# Patient Record
Sex: Female | Born: 1980 | Race: Black or African American | Hispanic: No | Marital: Single | Smoking: Never smoker
Health system: Southern US, Community
[De-identification: ages and names within clinical notes are randomized; demographics above are authoritative.]

## PROBLEM LIST (undated history)

## (undated) DIAGNOSIS — J45909 Unspecified asthma, uncomplicated: Secondary | ICD-10-CM

## (undated) HISTORY — PX: FOOT SURGERY: SHX648

## (undated) HISTORY — PX: CARPAL TUNNEL RELEASE: SHX101

---

## 2014-06-17 ENCOUNTER — Ambulatory Visit: Payer: Self-pay | Admitting: Physician Assistant

## 2014-08-12 ENCOUNTER — Emergency Department: Payer: Self-pay | Admitting: Emergency Medicine

## 2014-11-05 ENCOUNTER — Ambulatory Visit
Admit: 2014-11-05 | Disposition: A | Discharge status: Home / Self Care | Payer: Self-pay | Attending: Family Medicine | Admitting: Family Medicine

## 2014-11-05 LAB — COMPREHENSIVE METABOLIC PANEL
ALK PHOS: 30 U/L — AB
ALT: 16 U/L
ANION GAP: 8 (ref 7–16)
Albumin: 3.7 g/dL
BUN: 16 mg/dL
Bilirubin,Total: 0.4 mg/dL
CALCIUM: 9.1 mg/dL
CO2: 26 mmol/L
Chloride: 103 mmol/L
Creatinine: 1 mg/dL
EGFR (African American): >60
EGFR (Non-African Amer.): >60
GLUCOSE: 118 mg/dL — AB
Potassium: 3.3 mmol/L — ABNORMAL LOW
SGOT(AST): 33 U/L
SODIUM: 137 mmol/L
Total Protein: 7 g/dL

## 2014-11-05 LAB — CBC WITH DIFFERENTIAL/PLATELET
Basophil #: 0.1 10*3/uL (ref 0.0–0.1)
Basophil %: 1.1 %
EOS PCT: 1.7 %
Eosinophil #: 0.1 10*3/uL (ref 0.0–0.7)
HCT: 36.6 % (ref 35.0–47.0)
HGB: 12.1 g/dL (ref 12.0–16.0)
LYMPHS ABS: 2.6 10*3/uL (ref 1.0–3.6)
Lymphocyte %: 42.3 %
MCH: 26.7 pg (ref 26.0–34.0)
MCHC: 33.1 g/dL (ref 32.0–36.0)
MCV: 81 fL (ref 80–100)
Monocyte #: 0.7 x10 3/mm (ref 0.2–0.9)
Monocyte %: 11.1 %
NEUTROS PCT: 43.8 %
Neutrophil #: 2.7 10*3/uL (ref 1.4–6.5)
Platelet: 237 10*3/uL (ref 150–440)
RBC: 4.54 10*6/uL (ref 3.80–5.20)
RDW: 13.8 % (ref 11.5–14.5)
WBC: 6.1 10*3/uL (ref 3.6–11.0)

## 2014-11-05 LAB — TROPONIN I

## 2014-11-05 LAB — MAGNESIUM: MAGNESIUM: 1.9 mg/dL

## 2014-11-05 LAB — D-DIMER(ARMC): D-Dimer: 891 ng/ml

## 2014-11-05 LAB — CK-MB: CK-MB: 0.9 ng/mL

## 2014-11-05 LAB — CK: CK, Total: 244 U/L — ABNORMAL HIGH

## 2014-11-06 ENCOUNTER — Ambulatory Visit
Admit: 2014-11-06 | Disposition: A | Discharge status: Home / Self Care | Payer: Self-pay | Attending: Family Medicine | Admitting: Family Medicine

## 2014-11-06 LAB — TSH: Thyroid Stimulating Horm: 1.047 u[IU]/mL

## 2014-11-28 ENCOUNTER — Ambulatory Visit
Admission: EM | Admit: 2014-11-28 | Discharge: 2014-11-28 | Disposition: A | Discharge status: Home / Self Care | Payer: 59 | Attending: Family Medicine | Admitting: Family Medicine

## 2014-11-28 DIAGNOSIS — J029 Acute pharyngitis, unspecified: Secondary | ICD-10-CM | POA: Diagnosis not present

## 2014-11-28 DIAGNOSIS — J069 Acute upper respiratory infection, unspecified: Secondary | ICD-10-CM

## 2014-11-28 DIAGNOSIS — J3489 Other specified disorders of nose and nasal sinuses: Secondary | ICD-10-CM

## 2014-11-28 HISTORY — DX: Unspecified asthma, uncomplicated: J45.909

## 2014-11-28 LAB — RAPID STREP SCREEN (MED CTR MEBANE ONLY): STREPTOCOCCUS, GROUP A SCREEN (DIRECT): NEGATIVE

## 2014-11-28 MED ORDER — CEFUROXIME AXETIL 500 MG PO TABS
500.0000 mg | ORAL_TABLET | Freq: Two times a day (BID) | ORAL | Status: AC
Start: 2014-11-30 — End: ?

## 2014-11-28 MED ORDER — FEXOFENADINE-PSEUDOEPHED ER 180-240 MG PO TB24: 1.0000 | ORAL_TABLET | Freq: Every day | ORAL | Status: AC

## 2014-11-28 MED ORDER — FLUTICASONE PROPIONATE 50 MCG/ACT NA SUSP: 2.0000 | Freq: Every day | NASAL | Status: AC

## 2014-11-28 NOTE — ED Notes (Signed)
Pt states " I have had sore throat night. I had strep throat in Dec. I have had tonsil stones in the past."

## 2014-11-28 NOTE — Discharge Instructions (Signed)

## 2014-11-28 NOTE — ED Provider Notes (Signed)
CSN: [id number]     Arrival date & time 11/28/14  0818 History   First MD Initiated Contact with Patient 11/28/14 0912     Chief Complaint  Patient presents with  . Sore Throat   (Consider location/radiation/quality/duration/timing/severity/associated sxs/prior Treatment) Patient is a 34 y.o. female presenting with pharyngitis. The history is provided by the patient. No language interpreter was used.  Sore Throat This is a new problem. The current episode started more than 2 days ago (3 days ago). The problem occurs constantly. The problem has not changed since onset.The symptoms are aggravated by swallowing and sneezing. Nothing relieves the symptoms. She has tried nothing for the symptoms. The treatment provided no relief.    Past Medical History  Diagnosis Date  . Asthma    Past Surgical History  Procedure Laterality Date  . Carpal tunnel release    . Foot surgery     History reviewed. No pertinent family history. History  Substance Use Topics  . Smoking status: Never Smoker   . Smokeless tobacco: Not on file  . Alcohol Use: Yes   OB History    No data available     Review of Systems  HENT: Positive for postnasal drip and rhinorrhea.   Respiratory: Positive for cough.   Cardiovascular: Negative.   Endocrine: Negative.   Skin: Negative.     Allergies  Latex and Penciclovir  Home Medications   Prior to Admission medications   Medication Sig Start Date End Date Taking? Authorizing Provider  cefUROXime (CEFTIN) 500 MG tablet Take 1 tablet (500 mg total) by mouth 2 (two) times daily. Patient may start this medication on 11/30/2014. This prescription may be filled up until 01/10/2015. 11/30/14   Hassan RowanEugene Amaru Burroughs, MD  fexofenadine-pseudoephedrine (ALLEGRA-D ALLERGY & CONGESTION) 180-240 MG per 24 hr tablet Take 1 tablet by mouth daily. 11/28/14   Hassan RowanEugene Damarys Speir, MD  fluticasone (FLONASE) 50 MCG/ACT nasal spray Place 2 sprays into both nostrils daily. 11/28/14   Hassan RowanEugene Lorenza Winkleman, MD    BP 111/76 mmHg  Pulse 102  Temp(Src) 97.6 F (36.4 C) (Tympanic)  Ht 5\' 3"  (1.6 m)  Wt 210 lb (95.255 kg)  BMI 37.21 kg/m2  SpO2 99%  LMP 10/16/2014 (Exact Date) Physical Exam  Constitutional: She appears well-developed and well-nourished. No distress.  HENT:  Head: Normocephalic.  Right Ear: External ear normal.  Left Ear: External ear normal.  Nose: Rhinorrhea present. Right sinus exhibits no maxillary sinus tenderness and no frontal sinus tenderness. Left sinus exhibits no maxillary sinus tenderness.    Mouth/Throat: Normal dentition. No dental abscesses or dental caries. Posterior oropharyngeal edema and posterior oropharyngeal erythema present.  Vitals reviewed.   ED Course  Procedures (including critical care time) Labs Review Labs Reviewed  RAPID STREP SCREEN  CULTURE, GROUP A STREP [facility])   Results for orders placed or performed during the hospital encounter of 11/28/14  Rapid strep screen  Result Value Ref Range   Streptococcus, Group A Screen (Direct) NEGATIVE NEGATIVE   Strep test is negative. 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 She's had a history of having trouble sinuses as URI progresses. Will treat for now with some Allegra-D, Flonase and a work note for [employer]. If she is not better by Saturday I will give her a postdated prescription for some Ceftin for her to start taking. Imaging Review No results found.   MDM   1. URI (upper respiratory infection)   2. Pharyngitis   3. Rhinorrhea  Hassan RowanEugene Adison Jerger, MD 11/28/14 [phone]

## 2014-12-01 LAB — CULTURE, GROUP A STREP (THRC)

## 2015-01-29 ENCOUNTER — Ambulatory Visit
Admission: EM | Admit: 2015-01-29 | Discharge: 2015-01-29 | Disposition: A | Discharge status: Home / Self Care | Payer: 59 | Attending: Family Medicine | Admitting: Family Medicine

## 2015-01-29 ENCOUNTER — Encounter: Payer: Self-pay | Admitting: Emergency Medicine

## 2015-01-29 DIAGNOSIS — N926 Irregular menstruation, unspecified: Secondary | ICD-10-CM | POA: Diagnosis not present

## 2015-01-29 DIAGNOSIS — Z3202 Encounter for pregnancy test, result negative: Secondary | ICD-10-CM | POA: Diagnosis not present

## 2015-01-29 DIAGNOSIS — Z79899 Other long term (current) drug therapy: Secondary | ICD-10-CM | POA: Diagnosis not present

## 2015-01-29 DIAGNOSIS — J45909 Unspecified asthma, uncomplicated: Secondary | ICD-10-CM | POA: Diagnosis not present

## 2015-01-29 DIAGNOSIS — N898 Other specified noninflammatory disorders of vagina: Secondary | ICD-10-CM | POA: Insufficient documentation

## 2015-01-29 LAB — URINALYSIS COMPLETE WITH MICROSCOPIC (ARMC ONLY)
BACTERIA UA: NONE SEEN — AB
GLUCOSE, UA: NEGATIVE mg/dL
Hgb urine dipstick: NEGATIVE
Leukocytes, UA: NEGATIVE
Nitrite: NEGATIVE
PH: 5.5 (ref 5.0–8.0)
PROTEIN: 30 mg/dL — AB
RBC / HPF: NONE SEEN RBC/hpf (ref <3)
SPECIFIC GRAVITY, URINE: 1.025 (ref 1.005–1.030)

## 2015-01-29 LAB — WET PREP, GENITAL
Clue Cells Wet Prep HPF POC: NONE SEEN
Trich, Wet Prep: NONE SEEN
WBC, Wet Prep HPF POC: NONE SEEN
Yeast Wet Prep HPF POC: NONE SEEN

## 2015-01-29 LAB — CHLAMYDIA/NGC RT PCR (ARMC ONLY)
Chlamydia Tr: NOT DETECTED
N gonorrhoeae: NOT DETECTED

## 2015-01-29 LAB — PREGNANCY, URINE: PREG TEST UR: NEGATIVE

## 2015-01-29 LAB — HCG, QUANTITATIVE, PREGNANCY: hCG, Beta Chain, Quant, S: <1 m[IU]/mL (ref <5)

## 2015-01-29 NOTE — ED Notes (Addendum)
Discharge instructions went over with pt. Pt verbalized understanding of discharge information. Pt was ambulatory leaving clinic

## 2015-01-29 NOTE — Discharge Instructions (Signed)
Abnormal Uterine Bleeding Abnormal uterine bleeding means bleeding from the vagina that is not your normal menstrual period. This can be:  Bleeding or spotting between periods.  Bleeding after sex (sexual intercourse).  Bleeding that is heavier or more than normal.  Periods that last longer than usual.  Bleeding after menopause. There are many problems that may cause this. Treatment will depend on the cause of the bleeding. Any kind of bleeding that is not normal should be reviewed by your doctor.  HOME CARE Watch your condition for any changes. These actions may lessen any discomfort you are having:  Do not use tampons or douches as told by your doctor.  Change your pads often. You should get regular pelvic exams and Pap tests. Keep all appointments for tests as told by your doctor. GET HELP IF:  You are bleeding for more than 1 week.  You feel dizzy at times. GET HELP RIGHT AWAY IF:   You pass out.  You have to change pads every 15 to 30 minutes.  You have belly pain.  You have a fever.  You become sweaty or weak.  You are passing large blood clots from the vagina.  You feel sick to your stomach (nauseous) and throw up (vomit). MAKE SURE YOU:  Understand these instructions.  Will watch your condition.  Will get help right away if you are not doing well or get worse. Document Released: 04/25/2009 Document Revised: 07/03/2013 Document Reviewed: 01/25/2013 [facility] Patient Information 2015 [place]. This information is not intended to replace advice given to you by your health care provider. Make sure you discuss any questions you have with your health care provider.  Your pregnancy test was negative today, your wet prep was normal today. Please follow up with your PCP for general medical issues. Follow up with your OB/GYn for further evaluation of irregular periods, recheck of vaginal discharge if symptoms persist next week-call for appt. Your cultures are  pending, we will call you of any positive results if further testing is required.

## 2015-01-29 NOTE — ED Provider Notes (Signed)
CSN: [id number]     Arrival date & time 01/29/15  4098 History   None    Chief Complaint  Patient presents with  . Vaginal Discharge   (Consider location/radiation/quality/duration/timing/severity/associated sxs/prior Treatment) Patient is a 34 y.o. female presenting with vaginal discharge. The history is provided by the patient. No language interpreter was used.  Vaginal Discharge Quality:  Thin and white Severity:  Moderate Onset quality:  Sudden Timing:  Constant Progression:  Unchanged Chronicity:  Recurrent Context: spontaneously   Context comment:  Recent new partner exposure Relieved by:  Nothing Ineffective treatments:  OTC medications (tried 3 day yeast treatment without improvement, + odor) Associated symptoms: no abdominal pain, no dysuria, no fever, no nausea and no vomiting   Risk factors: unprotected sex     Past Medical History  Diagnosis Date  . Asthma    Past Surgical History  Procedure Laterality Date  . Carpal tunnel release    . Foot surgery     History reviewed. No pertinent family history. History  Substance Use Topics  . Smoking status: Never Smoker   . Smokeless tobacco: Not on file  . Alcohol Use: Yes   OB History    No data available     Review of Systems  Constitutional: Positive for appetite change. Negative for fever and chills.  HENT: Negative.   Respiratory: Negative for shortness of breath.   Cardiovascular: Negative for chest pain.  Gastrointestinal: Negative for nausea, vomiting, abdominal pain, diarrhea and constipation.  Endocrine: Negative.   Genitourinary: Positive for vaginal discharge. Negative for dysuria, hematuria and vaginal bleeding.  Skin: Negative for rash.  Allergic/Immunologic: Negative.   Neurological: Negative.   Hematological: Negative.   Psychiatric/Behavioral: Negative.   All other systems reviewed and are negative.   Allergies  Latex; Penciclovir; and Penicillins  Home Medications   Prior to  Admission medications   Medication Sig Start Date End Date Taking? Authorizing Provider  cefUROXime (CEFTIN) 500 MG tablet Take 1 tablet (500 mg total) by mouth 2 (two) times daily. Patient may start this medication on 11/30/2014. This prescription may be filled up until 01/10/2015. 11/30/14   Hassan Rowan, MD  fexofenadine-pseudoephedrine (ALLEGRA-D ALLERGY & CONGESTION) 180-240 MG per 24 hr tablet Take 1 tablet by mouth daily. 11/28/14   Hassan Rowan, MD  fluticasone (FLONASE) 50 MCG/ACT nasal spray Place 2 sprays into both nostrils daily. 11/28/14   Hassan Rowan, MD   BP 109/66 mmHg  Pulse 110  Temp(Src) 98 F (36.7 C) (Oral)  Resp 16  SpO2 100%  LMP 10/16/2014 Physical Exam  Constitutional: She appears well-developed and well-nourished. She is active and cooperative.  Non-toxic appearance. She does not have a sickly appearance. She does not appear ill. No distress.  HENT:  Head: Normocephalic.  Right Ear: Tympanic membrane normal.  Left Ear: Tympanic membrane normal.  Nose: Nose normal.  Mouth/Throat: Uvula is midline, oropharynx is clear and moist and mucous membranes are normal.  Neck: Trachea normal.  Cardiovascular: Normal rate, regular rhythm, normal heart sounds and normal pulses.   Pulmonary/Chest: Effort normal and breath sounds normal.  Abdominal: Normal appearance and bowel sounds are normal. There is no tenderness.  Genitourinary: Uterus normal. Pelvic exam was performed with patient supine. No labial fusion. There is no rash, tenderness, lesion or injury on the right labia. There is no rash, tenderness, lesion or injury on the left labia. Cervix exhibits no motion tenderness, no discharge and no friability. Right adnexum displays no mass, no tenderness and no  fullness. Left adnexum displays no mass, no tenderness and no fullness. No erythema, tenderness or bleeding in the vagina. No foreign body around the vagina. No signs of injury around the vagina. Vaginal discharge found.  Bi  manual /pelvic exam performed by this provider, escorted by RN, Tiffany. Pt tolerated well, cultures obtained. No CMT, no adnexal tenderness bilaterally, no nausea, vomiting or chills. + thin white discharge noted in vault.   Neurological: She is alert. No cranial nerve deficit or sensory deficit. GCS eye subscore is 4. GCS verbal subscore is 5. GCS motor subscore is 6.  Psychiatric: She has a normal mood and affect. Her speech is normal.  Nursing note and vitals reviewed.   ED Course  Procedures (including critical care time) Labs Review Labs Reviewed  URINALYSIS COMPLETEWITH MICROSCOPIC ([facility] ONLY) - Abnormal; Notable for the following:    Color, Urine AMBER (*)    APPearance CLOUDY (*)    Bilirubin Urine 1+ (*)    Ketones, ur 1+ (*)    Protein, ur 30 (*)    Bacteria, UA NONE SEEN (*)    Squamous Epithelial / LPF 6-30 (*)    All other components within normal limits  WET PREP, GENITAL  URINE CULTURE  CHLAMYDIA/NGC RT PCR ([facility] ONLY)  PREGNANCY, URINE  HCG, QUANTITATIVE, PREGNANCY   Results for orders placed or performed during the hospital encounter of 01/29/15  Wet prep, genital  Result Value Ref Range   Yeast Wet Prep HPF POC NONE SEEN NONE SEEN   Trich, Wet Prep NONE SEEN NONE SEEN   Clue Cells Wet Prep HPF POC NONE SEEN NONE SEEN   WBC, Wet Prep HPF POC NONE SEEN NONE SEEN  Pregnancy, urine  Result Value Ref Range   Preg Test, Ur NEGATIVE NEGATIVE  Urinalysis complete, with microscopic  Result Value Ref Range   Color, Urine AMBER (A) YELLOW   APPearance CLOUDY (A) CLEAR   Glucose, UA NEGATIVE NEGATIVE mg/dL   Bilirubin Urine 1+ (A) NEGATIVE   Ketones, ur 1+ (A) NEGATIVE mg/dL   Specific Gravity, Urine 1.025 1.005 - 1.030   Hgb urine dipstick NEGATIVE NEGATIVE   pH 5.5 5.0 - 8.0   Protein, ur 30 (A) NEGATIVE mg/dL   Nitrite NEGATIVE NEGATIVE   Leukocytes, UA NEGATIVE NEGATIVE   RBC / HPF NONE SEEN <3 RBC/hpf   WBC, UA 0-5 <3 WBC/hpf   Bacteria, UA  NONE SEEN (A) RARE   Squamous Epithelial / LPF 6-30 (A) RARE   Mucous PRESENT    Ca Oxalate Crys, UA PRESENT   hCG, quantitative, pregnancy  Result Value Ref Range   hCG, Beta Chain, Quant, S <1 <5 mIU/mL    Imaging Review No results found.   MDM   1. Vaginal discharge   2. Negative pregnancy test   3. Irregular menses     1008: Discussed results with pt: Your pregnancy test was negative today, your wet prep was normal today. Please follow up with your PCP for general medical issues. Follow up with your OB/GYn for further evaluation of irregular periods, recheck of vaginal discharge if symptoms persist next week-call for appt. Your cultures are pending, we will call you of any positive results if further testing is required. Pt verbalized understanding to this provider.   Clancy GourdJeanette Athira Janowicz, NP 01/29/15 1027

## 2015-01-29 NOTE — ED Notes (Signed)
Pt states that she has had vaginal discharge for at least a week. With her menstrual period is over a week late.

## 2015-01-30 LAB — URINE CULTURE: Culture: 30000

## 2015-03-31 ENCOUNTER — Telehealth: Payer: Self-pay

## 2015-03-31 NOTE — Telephone Encounter (Signed)
LVM for pt to return my call to schedule repeat US.

## 2015-03-31 NOTE — Telephone Encounter (Signed)
-----   Message from Rayann Heman, CMA sent at 02/03/2015  3:09 PM EDT ----- Regarding: repeat US Call pt to schedule repeat RUQ [place] limited to evaluate hepatic cyst.

## 2015-04-28 NOTE — Telephone Encounter (Signed)
Mailed letter to pt requesting her to call me and schedule repeat US.

## 2015-08-04 DIAGNOSIS — M25462 Effusion, left knee: Secondary | ICD-10-CM | POA: Diagnosis not present

## 2015-08-04 DIAGNOSIS — G5622 Lesion of ulnar nerve, left upper limb: Secondary | ICD-10-CM | POA: Diagnosis not present

## 2015-08-04 DIAGNOSIS — S83272A Complex tear of lateral meniscus, current injury, left knee, initial encounter: Secondary | ICD-10-CM | POA: Diagnosis not present

## 2015-08-04 DIAGNOSIS — G5602 Carpal tunnel syndrome, left upper limb: Secondary | ICD-10-CM | POA: Diagnosis not present

## 2015-08-12 DIAGNOSIS — M25562 Pain in left knee: Secondary | ICD-10-CM | POA: Diagnosis not present

## 2015-08-12 DIAGNOSIS — S83272A Complex tear of lateral meniscus, current injury, left knee, initial encounter: Secondary | ICD-10-CM | POA: Diagnosis not present

## 2015-08-19 DIAGNOSIS — G5622 Lesion of ulnar nerve, left upper limb: Secondary | ICD-10-CM | POA: Diagnosis not present

## 2015-08-19 DIAGNOSIS — G5602 Carpal tunnel syndrome, left upper limb: Secondary | ICD-10-CM | POA: Diagnosis not present

## 2015-08-22 DIAGNOSIS — G5602 Carpal tunnel syndrome, left upper limb: Secondary | ICD-10-CM | POA: Diagnosis not present

## 2015-08-22 DIAGNOSIS — S83272A Complex tear of lateral meniscus, current injury, left knee, initial encounter: Secondary | ICD-10-CM | POA: Diagnosis not present

## 2015-08-22 DIAGNOSIS — M25562 Pain in left knee: Secondary | ICD-10-CM | POA: Diagnosis not present

## 2015-08-22 DIAGNOSIS — M25462 Effusion, left knee: Secondary | ICD-10-CM | POA: Diagnosis not present

## 2015-08-22 DIAGNOSIS — G5622 Lesion of ulnar nerve, left upper limb: Secondary | ICD-10-CM | POA: Diagnosis not present

## 2015-09-09 DIAGNOSIS — G5622 Lesion of ulnar nerve, left upper limb: Secondary | ICD-10-CM | POA: Diagnosis not present

## 2015-09-09 DIAGNOSIS — M65862 Other synovitis and tenosynovitis, left lower leg: Secondary | ICD-10-CM | POA: Diagnosis not present

## 2015-09-09 DIAGNOSIS — G5602 Carpal tunnel syndrome, left upper limb: Secondary | ICD-10-CM | POA: Diagnosis not present

## 2015-09-09 DIAGNOSIS — M25562 Pain in left knee: Secondary | ICD-10-CM | POA: Diagnosis not present

## 2015-09-09 DIAGNOSIS — M25462 Effusion, left knee: Secondary | ICD-10-CM | POA: Diagnosis not present

## 2015-09-09 DIAGNOSIS — M2242 Chondromalacia patellae, left knee: Secondary | ICD-10-CM | POA: Diagnosis not present

## 2015-09-09 DIAGNOSIS — S83272A Complex tear of lateral meniscus, current injury, left knee, initial encounter: Secondary | ICD-10-CM | POA: Diagnosis not present

## 2015-09-09 DIAGNOSIS — M94262 Chondromalacia, left knee: Secondary | ICD-10-CM | POA: Diagnosis not present

## 2015-09-15 DIAGNOSIS — M25562 Pain in left knee: Secondary | ICD-10-CM | POA: Diagnosis not present

## 2015-09-15 DIAGNOSIS — S83272D Complex tear of lateral meniscus, current injury, left knee, subsequent encounter: Secondary | ICD-10-CM | POA: Diagnosis not present

## 2015-09-25 DIAGNOSIS — M25562 Pain in left knee: Secondary | ICD-10-CM | POA: Diagnosis not present

## 2015-09-25 DIAGNOSIS — S83272D Complex tear of lateral meniscus, current injury, left knee, subsequent encounter: Secondary | ICD-10-CM | POA: Diagnosis not present

## 2015-10-01 DIAGNOSIS — M25562 Pain in left knee: Secondary | ICD-10-CM | POA: Diagnosis not present

## 2015-10-01 DIAGNOSIS — S83272D Complex tear of lateral meniscus, current injury, left knee, subsequent encounter: Secondary | ICD-10-CM | POA: Diagnosis not present

## 2015-10-10 DIAGNOSIS — M25562 Pain in left knee: Secondary | ICD-10-CM | POA: Diagnosis not present

## 2015-10-10 DIAGNOSIS — S83272D Complex tear of lateral meniscus, current injury, left knee, subsequent encounter: Secondary | ICD-10-CM | POA: Diagnosis not present

## 2015-10-17 DIAGNOSIS — S83272D Complex tear of lateral meniscus, current injury, left knee, subsequent encounter: Secondary | ICD-10-CM | POA: Diagnosis not present

## 2015-10-17 DIAGNOSIS — M25562 Pain in left knee: Secondary | ICD-10-CM | POA: Diagnosis not present

## 2015-10-21 DIAGNOSIS — F4322 Adjustment disorder with anxiety: Secondary | ICD-10-CM | POA: Diagnosis not present

## 2015-10-22 DIAGNOSIS — M25562 Pain in left knee: Secondary | ICD-10-CM | POA: Diagnosis not present

## 2015-10-22 DIAGNOSIS — S83272D Complex tear of lateral meniscus, current injury, left knee, subsequent encounter: Secondary | ICD-10-CM | POA: Diagnosis not present

## 2015-10-31 DIAGNOSIS — S83272D Complex tear of lateral meniscus, current injury, left knee, subsequent encounter: Secondary | ICD-10-CM | POA: Diagnosis not present

## 2015-10-31 DIAGNOSIS — M25562 Pain in left knee: Secondary | ICD-10-CM | POA: Diagnosis not present

## 2015-11-14 DIAGNOSIS — S83272D Complex tear of lateral meniscus, current injury, left knee, subsequent encounter: Secondary | ICD-10-CM | POA: Diagnosis not present

## 2015-11-14 DIAGNOSIS — M25562 Pain in left knee: Secondary | ICD-10-CM | POA: Diagnosis not present

## 2015-11-20 DIAGNOSIS — S83272D Complex tear of lateral meniscus, current injury, left knee, subsequent encounter: Secondary | ICD-10-CM | POA: Diagnosis not present

## 2015-11-20 DIAGNOSIS — M25562 Pain in left knee: Secondary | ICD-10-CM | POA: Diagnosis not present

## 2016-01-29 DIAGNOSIS — F4322 Adjustment disorder with anxiety: Secondary | ICD-10-CM | POA: Diagnosis not present

## 2016-01-29 DIAGNOSIS — F909 Attention-deficit hyperactivity disorder, unspecified type: Secondary | ICD-10-CM | POA: Diagnosis not present

## 2016-02-26 DIAGNOSIS — Z113 Encounter for screening for infections with a predominantly sexual mode of transmission: Secondary | ICD-10-CM | POA: Diagnosis not present

## 2016-02-26 DIAGNOSIS — Z01419 Encounter for gynecological examination (general) (routine) without abnormal findings: Secondary | ICD-10-CM | POA: Diagnosis not present

## 2016-02-26 DIAGNOSIS — Z6836 Body mass index (BMI) 36.0-36.9, adult: Secondary | ICD-10-CM | POA: Diagnosis not present

## 2016-02-26 DIAGNOSIS — Z1389 Encounter for screening for other disorder: Secondary | ICD-10-CM | POA: Diagnosis not present

## 2016-02-26 DIAGNOSIS — Z114 Encounter for screening for human immunodeficiency virus [HIV]: Secondary | ICD-10-CM | POA: Diagnosis not present

## 2016-02-26 DIAGNOSIS — Z3009 Encounter for other general counseling and advice on contraception: Secondary | ICD-10-CM | POA: Diagnosis not present

## 2016-03-30 DIAGNOSIS — Z23 Encounter for immunization: Secondary | ICD-10-CM | POA: Diagnosis not present

## 2016-03-30 DIAGNOSIS — Z111 Encounter for screening for respiratory tuberculosis: Secondary | ICD-10-CM | POA: Diagnosis not present

## 2016-08-03 DIAGNOSIS — F4322 Adjustment disorder with anxiety: Secondary | ICD-10-CM | POA: Diagnosis not present

## 2016-08-19 DIAGNOSIS — Z30431 Encounter for routine checking of intrauterine contraceptive device: Secondary | ICD-10-CM | POA: Diagnosis not present

## 2016-08-19 DIAGNOSIS — Z3202 Encounter for pregnancy test, result negative: Secondary | ICD-10-CM | POA: Diagnosis not present

## 2016-08-19 DIAGNOSIS — Z30430 Encounter for insertion of intrauterine contraceptive device: Secondary | ICD-10-CM | POA: Diagnosis not present

## 2016-08-19 DIAGNOSIS — Z6837 Body mass index (BMI) 37.0-37.9, adult: Secondary | ICD-10-CM | POA: Diagnosis not present

## 2016-08-27 DIAGNOSIS — F909 Attention-deficit hyperactivity disorder, unspecified type: Secondary | ICD-10-CM | POA: Diagnosis not present

## 2016-08-27 DIAGNOSIS — F4322 Adjustment disorder with anxiety: Secondary | ICD-10-CM | POA: Diagnosis not present

## 2016-09-02 DIAGNOSIS — Z30431 Encounter for routine checking of intrauterine contraceptive device: Secondary | ICD-10-CM | POA: Diagnosis not present

## 2016-09-02 DIAGNOSIS — Z6837 Body mass index (BMI) 37.0-37.9, adult: Secondary | ICD-10-CM | POA: Diagnosis not present

## 2016-09-24 DIAGNOSIS — M25552 Pain in left hip: Secondary | ICD-10-CM | POA: Diagnosis not present

## 2016-10-13 DIAGNOSIS — Z30431 Encounter for routine checking of intrauterine contraceptive device: Secondary | ICD-10-CM | POA: Diagnosis not present

## 2016-10-13 DIAGNOSIS — Z6837 Body mass index (BMI) 37.0-37.9, adult: Secondary | ICD-10-CM | POA: Diagnosis not present

## 2016-12-07 IMAGING — CR DG HAND COMPLETE 3+V*L*
1 series · 3 of 3 positions shown · non-contrast
Comparison: None.

CLINICAL DATA: First and second metacarpal pain after direct trauma
from IV pole. Initial encounter.

EXAM:
LEFT HAND - COMPLETE 3+ VIEW

[Series 1: x hand pa left · 0.14mm/px · 3 of 3 slices shown]
[im 1/3]
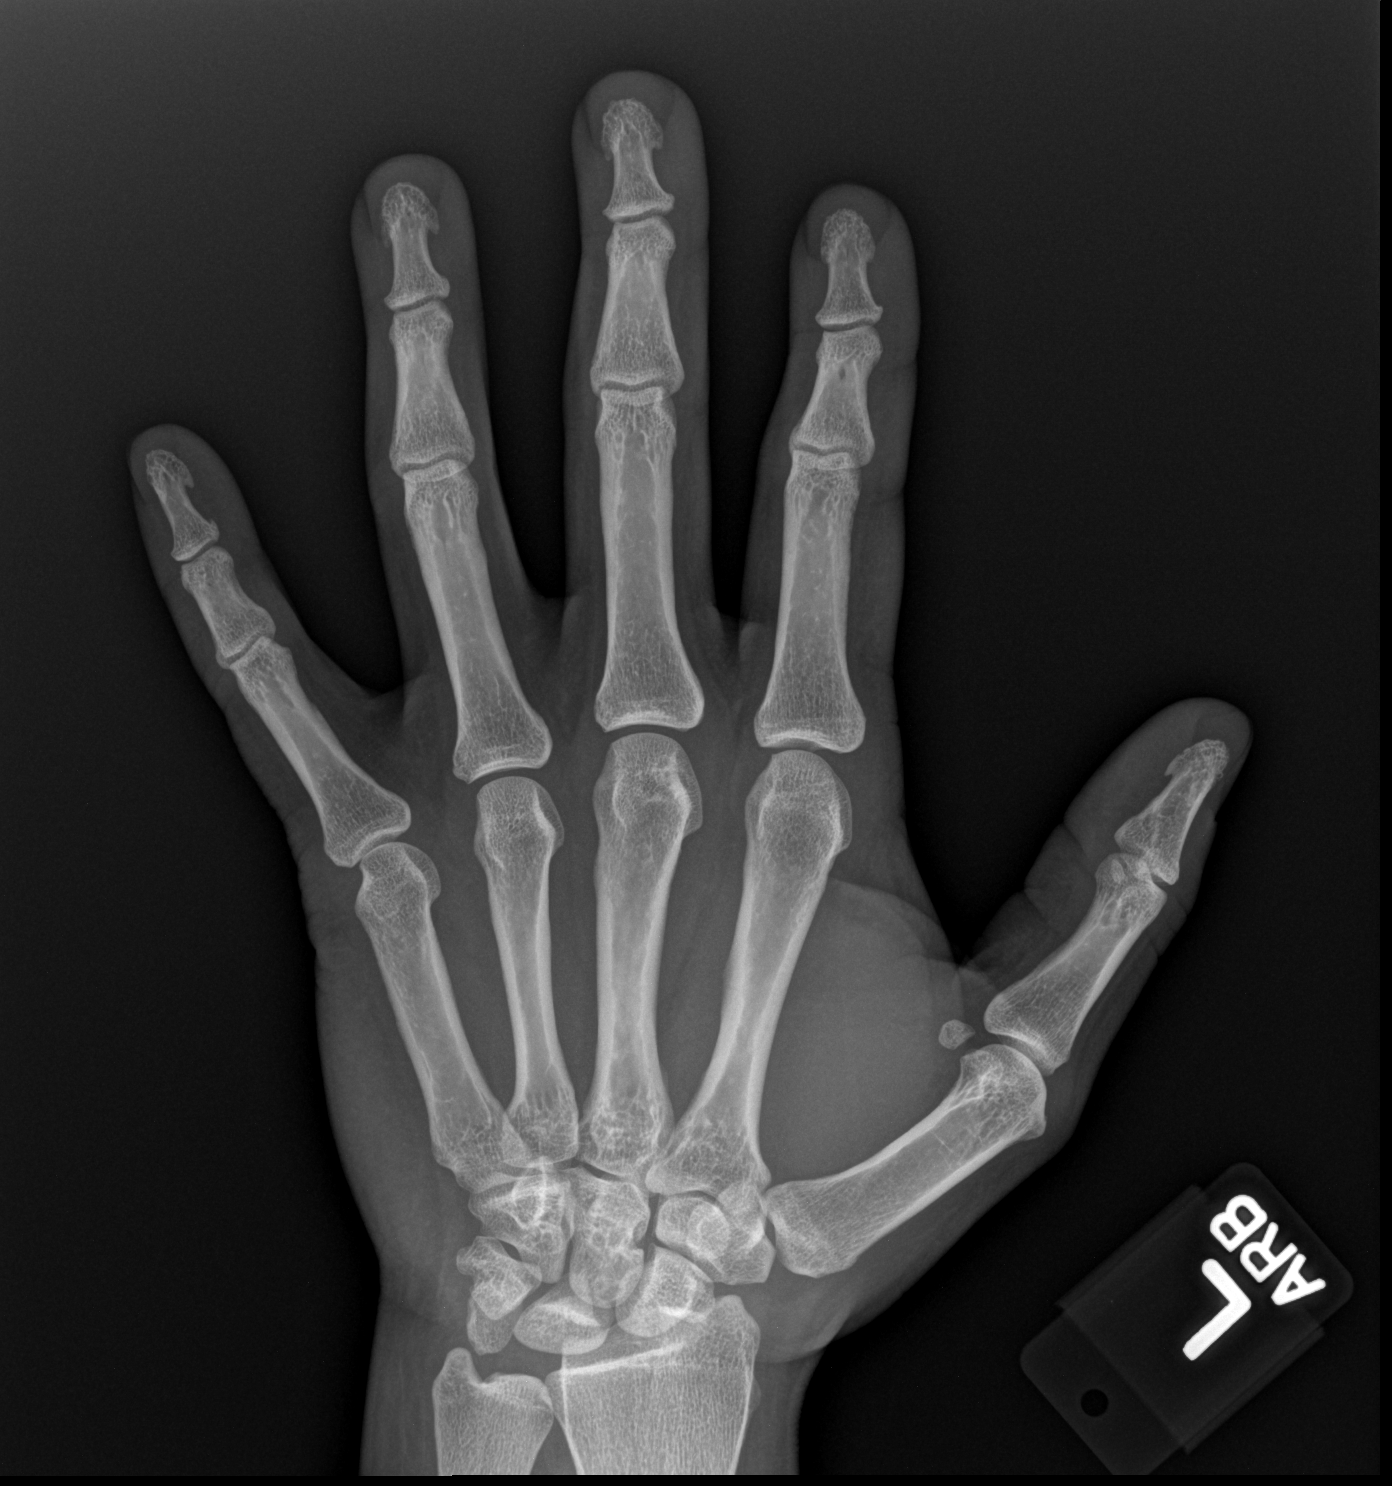
[im 2/3]
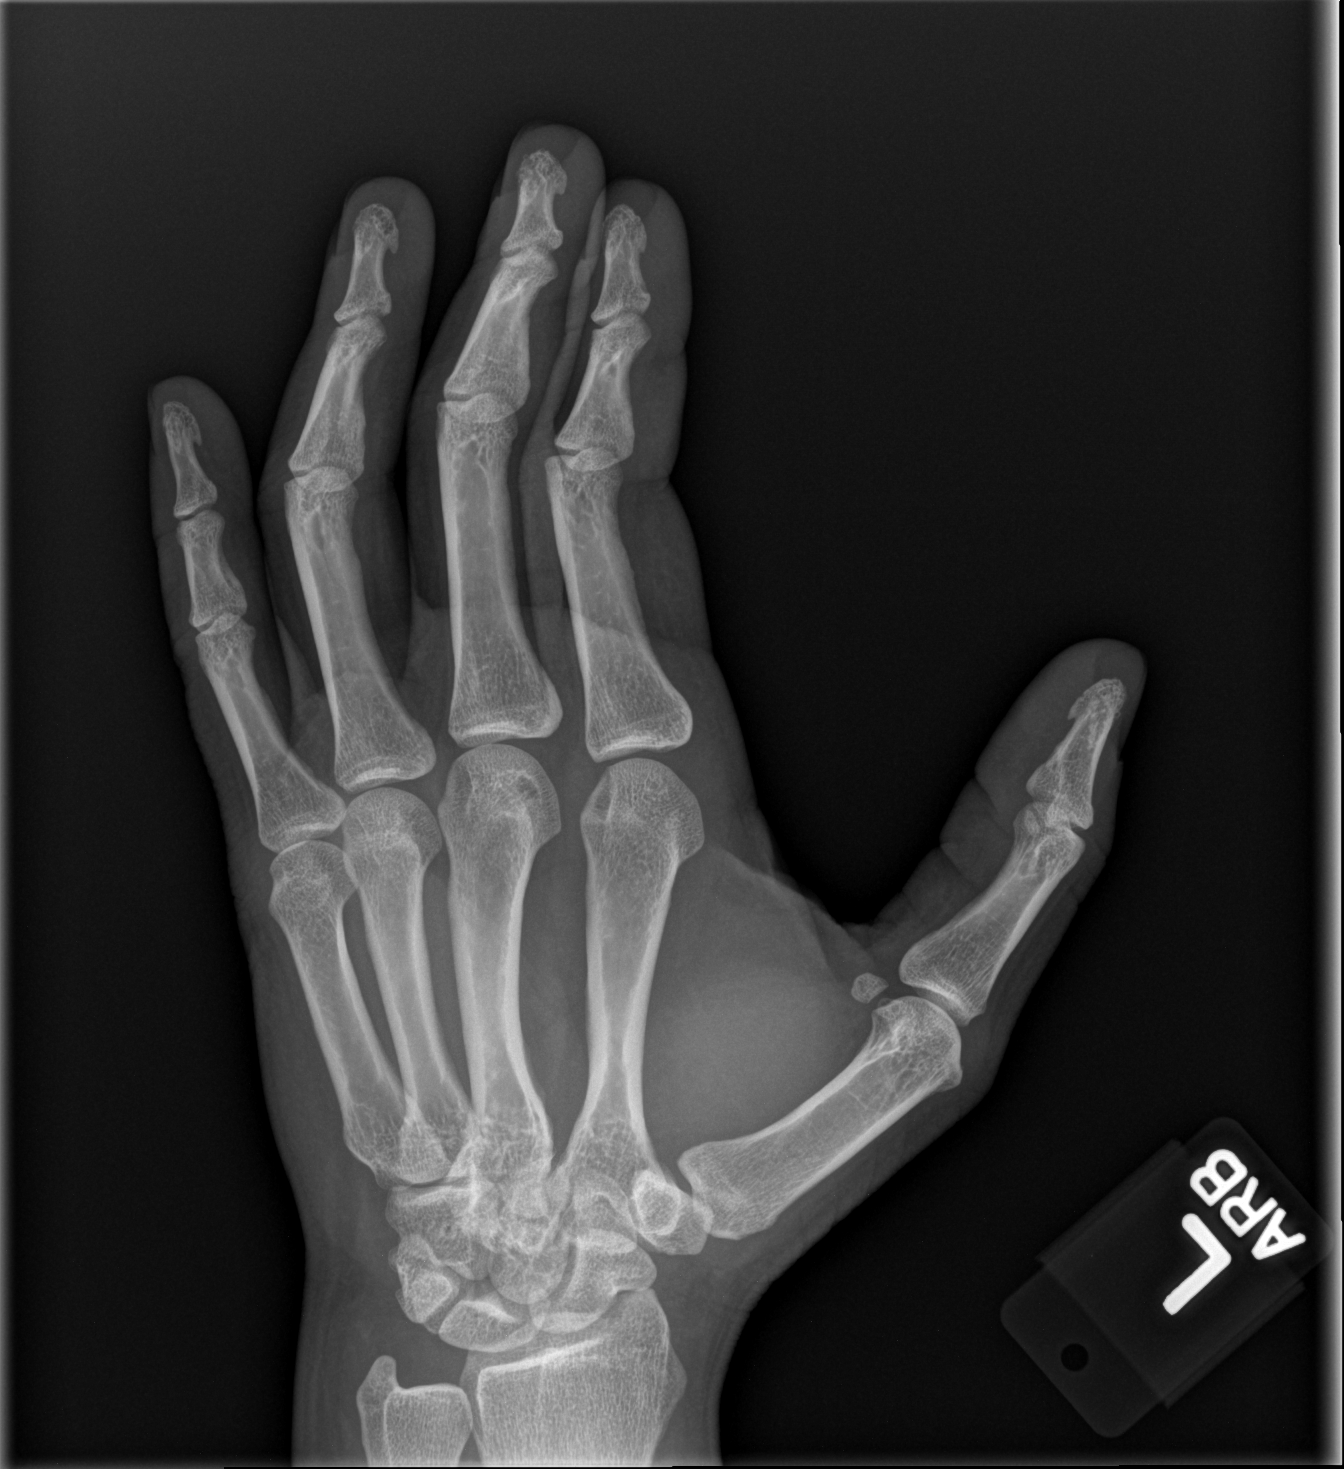
[im 3/3]
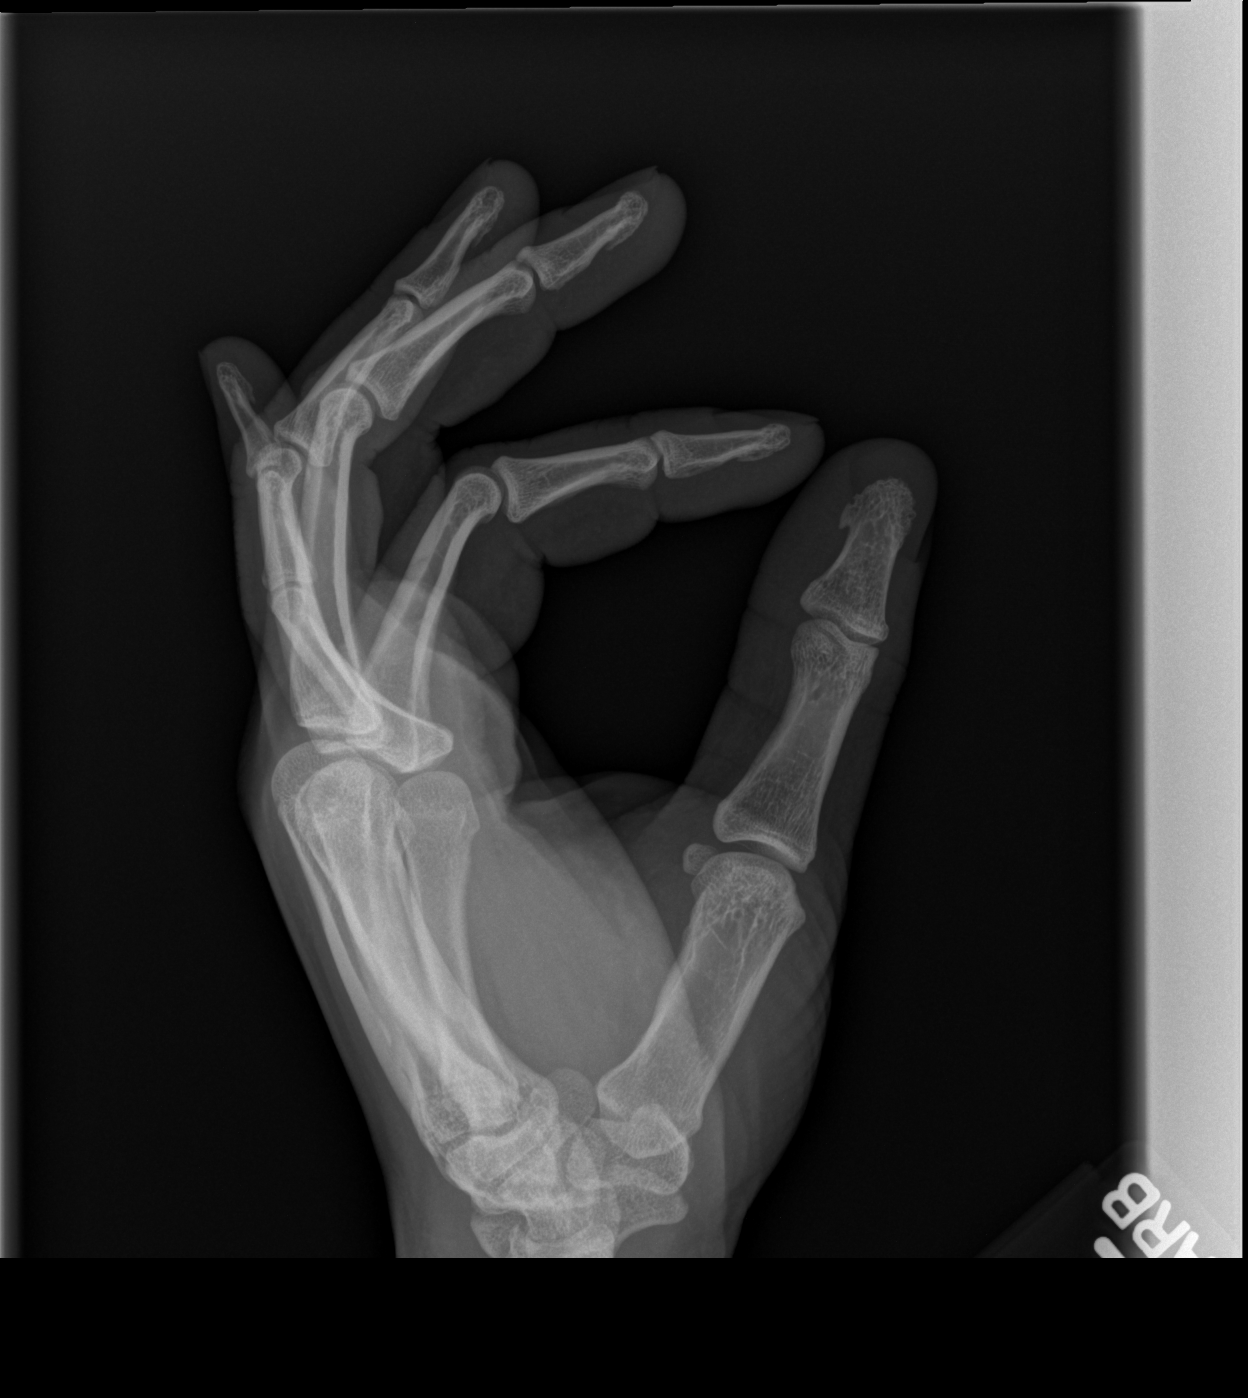

[3 of 3 positions shown; findings below may reference images not displayed]

FINDINGS: No acute fracture or dislocation.

No radiopaque foreign body or subcutaneous gas.

There is incidental, chronic appearing cystic change in the
capitate.
IMPRESSION: No acute osseous findings.

## 2017-03-03 IMAGING — CT CT ANGIO CHEST
1 of 2 series · 18 of 30 positions shown · IV contrast (APPLIED)
Comparison: None.

CLINICAL DATA: Shortness of breath with exertion. Recent prolonged
road trip. Elevated D-dimer. Persistent tachycardia for several
days.

EXAM:
CT ANGIOGRAPHY CHEST WITH CONTRAST
TECHNIQUE: Multidetector CT imaging of the chest was performed using the
standard protocol during bolus administration of intravenous
contrast. Multiplanar CT image reconstructions and MIPs were
obtained to evaluate the vascular anatomy.
CONTRAST:  100 cc Omnipaque 350

[Series 6: pe 1.0 thins · axial · 0.61mm/px · z∈[-560,-348]mm · 18 of 239 slices shown]
[im 14/239  lung]
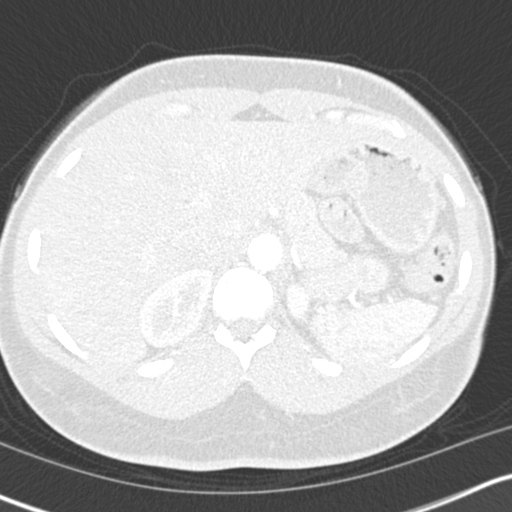
[im 27/239  mediastinal]
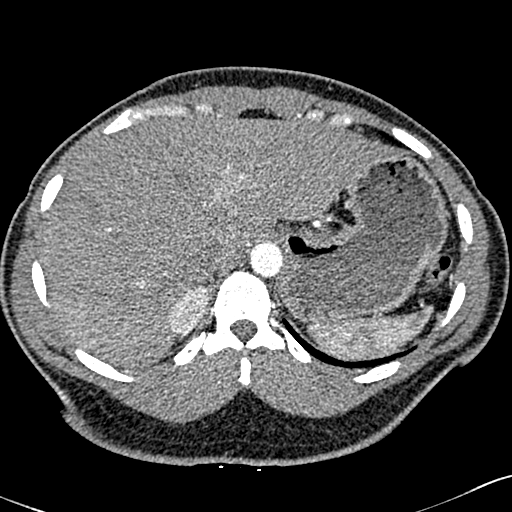
[im 40/239  lung]
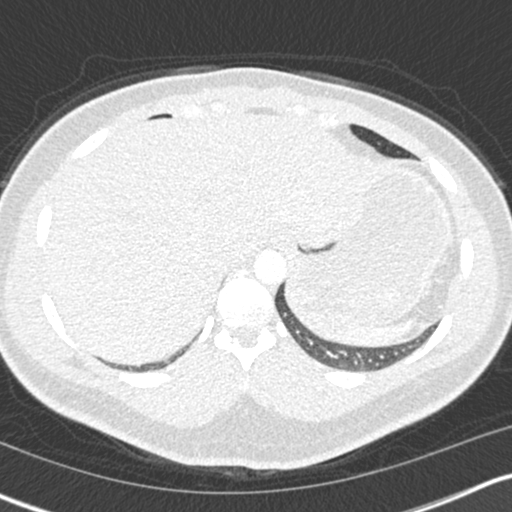
[im 53/239  mediastinal]
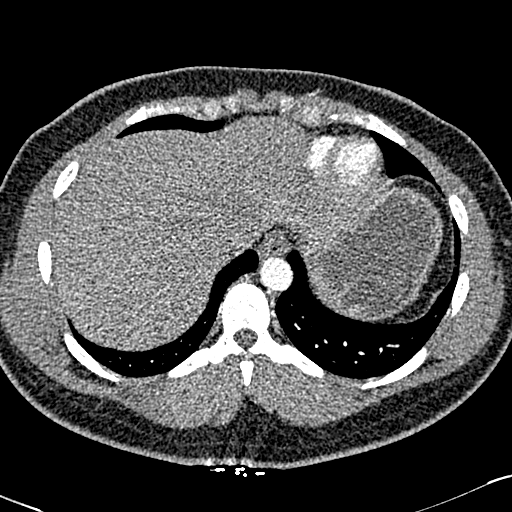
[im 67/239  lung]
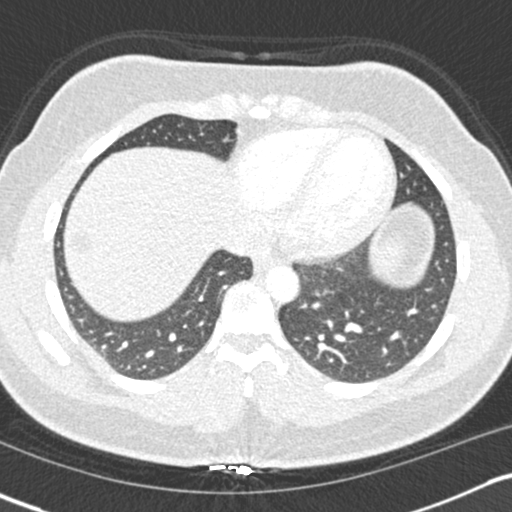
[im 80/239  mediastinal]
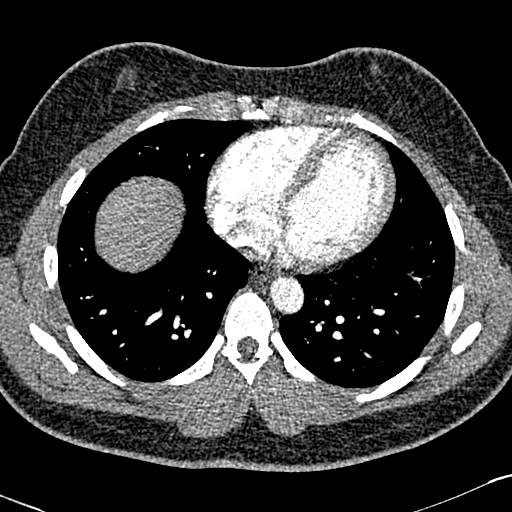
[im 93/239  lung]
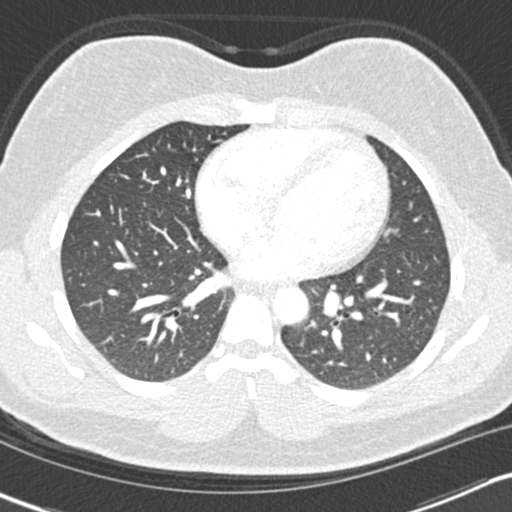
[im 106/239  mediastinal]
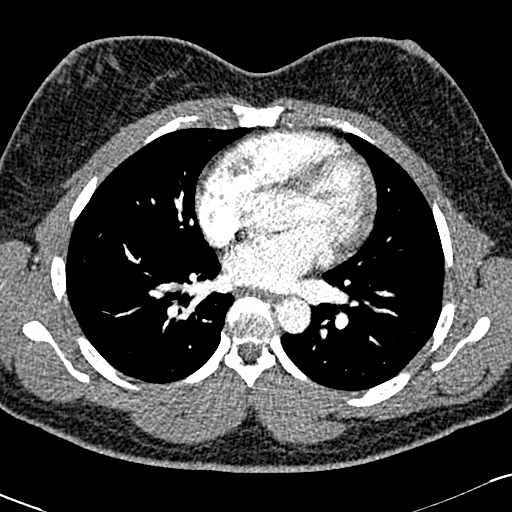
[im 110/239  lung]
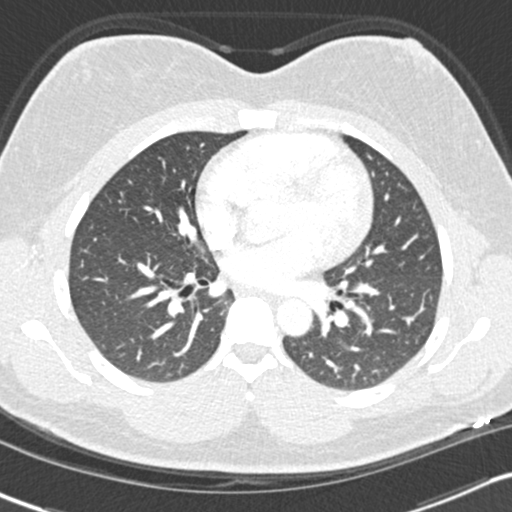
[im 120/239  mediastinal]
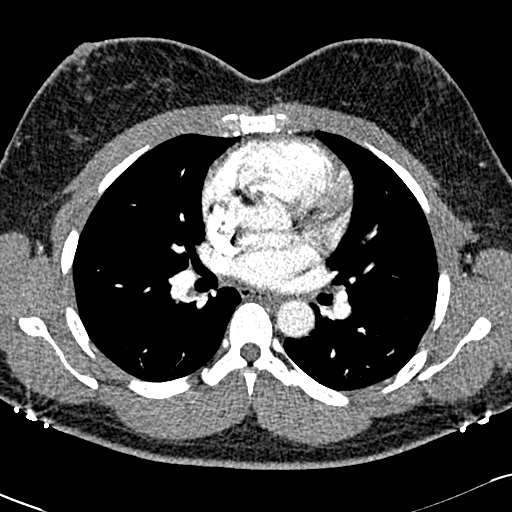
[im 133/239  lung]
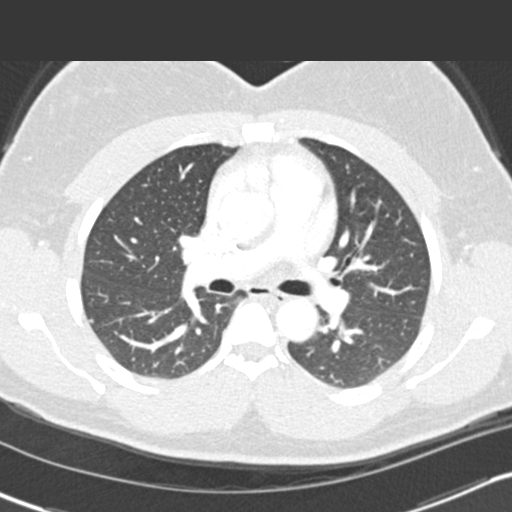
[im 146/239  mediastinal]
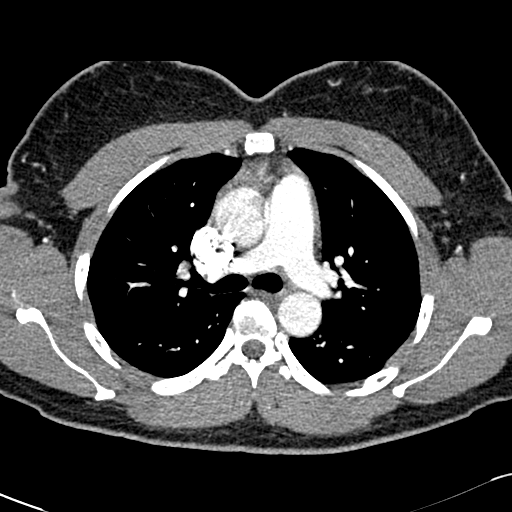
[im 159/239  lung]
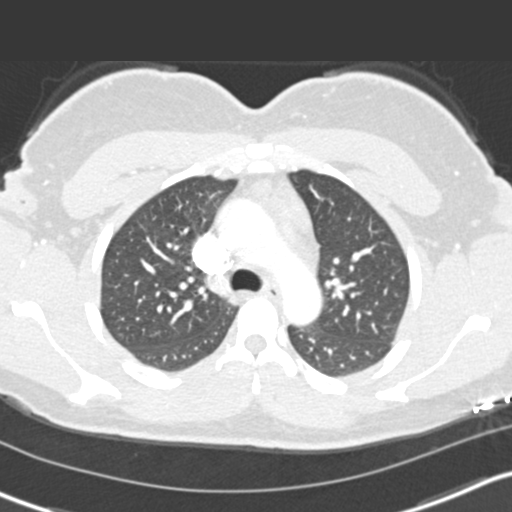
[im 172/239  mediastinal]
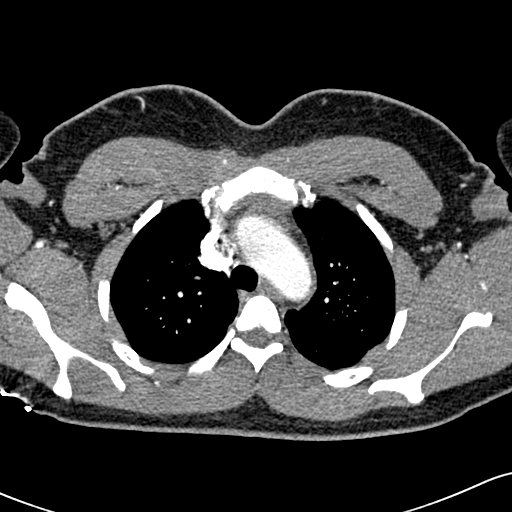
[im 186/239  lung]
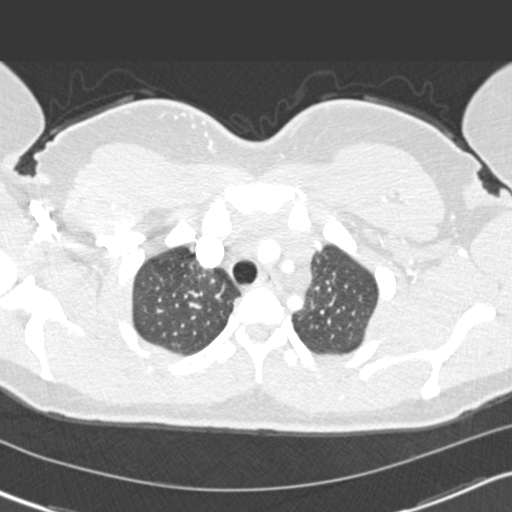
[im 199/239  mediastinal]
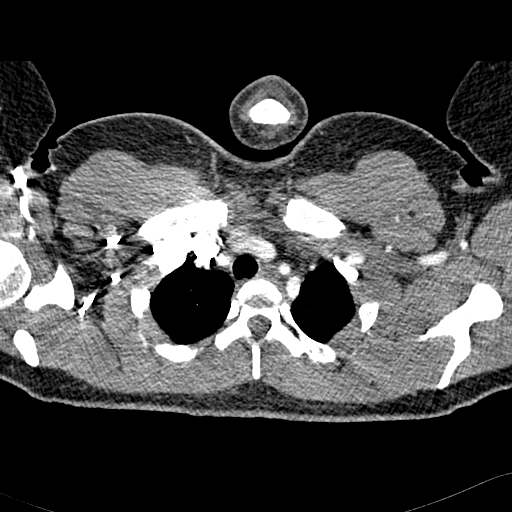
[im 212/239  lung]
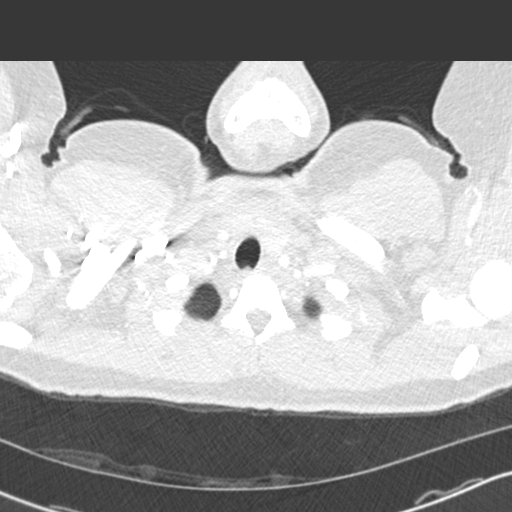
[im 225/239  mediastinal]
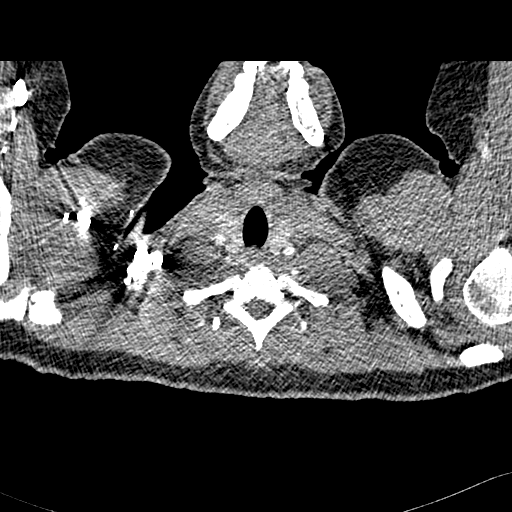

[18 of 30 positions shown; findings below may reference images not displayed]

FINDINGS: Mediastinum/Nodes: No filling defect is identified in the pulmonary
arterial tree to suggest pulmonary embolus. Aorta and branch vessels
unremarkable. Anterior mediastinal soft tissue density favors
thymus. Heart size normal. No pathologic mediastinal adenopathy.

Lungs/Pleura: Unremarkable

Upper abdomen: 1.4 cm fluid density lesion in the dome of the right
hepatic lobe.

Musculoskeletal: Mild thoracic spondylosis.

Review of the MIP images confirms the above findings.
IMPRESSION: 1. A cause for the patient's shortness of breath with exertion is
not identified. No filling defect is identified in the pulmonary
arterial tree to suggest pulmonary embolus.
2. 1.4 cm fluid density lesion in the dome of the right hepatic
lobe, probably a cyst or less likely hemangioma.
3. Mild thoracic spondylosis.
